# Patient Record
Sex: Male | Born: 1950 | Race: Black or African American | Hispanic: No
Health system: Southern US, Community
[De-identification: ages and names within clinical notes are randomized; demographics above are authoritative.]

---

## 1997-10-30 ENCOUNTER — Emergency Department (HOSPITAL_COMMUNITY): Admission: EM | Admit: 1997-10-30 | Discharge: 1997-10-30 | Payer: Self-pay | Admitting: Emergency Medicine

## 1997-11-02 ENCOUNTER — Ambulatory Visit (HOSPITAL_COMMUNITY): Admission: RE | Admit: 1997-11-02 | Discharge: 1997-11-02 | Payer: Self-pay | Admitting: Pulmonary Disease

## 2000-12-20 ENCOUNTER — Emergency Department (HOSPITAL_COMMUNITY): Admission: EM | Admit: 2000-12-20 | Discharge: 2000-12-20 | Payer: Self-pay | Admitting: Emergency Medicine

## 2000-12-20 ENCOUNTER — Encounter: Payer: Self-pay | Admitting: Emergency Medicine

## 2007-04-26 ENCOUNTER — Ambulatory Visit (HOSPITAL_COMMUNITY): Admission: RE | Admit: 2007-04-26 | Discharge: 2007-04-26 | Payer: Self-pay | Admitting: Pulmonary Disease

## 2009-06-13 ENCOUNTER — Ambulatory Visit (HOSPITAL_COMMUNITY): Admission: RE | Admit: 2009-06-13 | Discharge: 2009-06-13 | Payer: Self-pay | Admitting: Pulmonary Disease

## 2015-10-16 ENCOUNTER — Other Ambulatory Visit: Payer: Self-pay | Admitting: Cardiology

## 2015-10-16 DIAGNOSIS — R079 Chest pain, unspecified: Secondary | ICD-10-CM

## 2015-10-24 ENCOUNTER — Encounter (HOSPITAL_COMMUNITY)
Admission: RE | Admit: 2015-10-24 | Discharge: 2015-10-24 | Disposition: A | Discharge status: Home / Self Care | Payer: Medicare Other | Source: Ambulatory Visit | Attending: Cardiology | Admitting: Cardiology

## 2015-10-24 DIAGNOSIS — R079 Chest pain, unspecified: Secondary | ICD-10-CM | POA: Insufficient documentation

## 2015-10-24 MED ORDER — TECHNETIUM TC 99M TETROFOSMIN IV KIT
10.0000 | PACK | Freq: Once | INTRAVENOUS | Status: AC | PRN
  Administered 2015-10-24: 10 via INTRAVENOUS

## 2015-10-24 MED ORDER — REGADENOSON 0.4 MG/5ML IV SOLN
0.4000 mg | Freq: Once | INTRAVENOUS | Status: AC
  Administered 2015-10-24: 0.4 mg via INTRAVENOUS

## 2015-10-24 MED ORDER — REGADENOSON 0.4 MG/5ML IV SOLN
INTRAVENOUS | Status: AC
  Filled 2015-10-24: qty 5

## 2015-10-24 MED ORDER — TECHNETIUM TC 99M TETROFOSMIN IV KIT
30.0000 | PACK | Freq: Once | INTRAVENOUS | Status: AC | PRN
  Administered 2015-10-24: 30 via INTRAVENOUS

## 2019-05-12 ENCOUNTER — Ambulatory Visit: Payer: Self-pay | Attending: Internal Medicine

## 2019-05-12 DIAGNOSIS — Z23 Encounter for immunization: Secondary | ICD-10-CM | POA: Insufficient documentation

## 2019-05-12 NOTE — Progress Notes (Signed)
   Cov[facility] Vaccination [facility]  Name:  Bradley Woodward    MRN: [id number] DOB: 1951/01/14  05/12/2019  [name] was observed post Covid-19 immunization for 15 minutes without incident. He was provided with Vaccine Information Sheet and instruction to access the V-Safe system.   [name] was instructed to call 911 with any severe reactions post vaccine: Marland Kitchen Difficulty breathing  . Swelling of face and throat  . A fast heartbeat  . A bad rash all over body  . Dizziness and weakness   Immunizations Administered    Name Date Dose VIS Date Route   Pfizer COVID-19 Vaccine 05/12/2019  4:24 PM 0.3 mL 02/18/2019 Intramuscular   Manufacturer: [employer], [employer]   Lot: [id number]   NDC: [address]-8

## 2019-06-08 ENCOUNTER — Ambulatory Visit: Payer: Self-pay | Attending: Internal Medicine

## 2019-06-08 DIAGNOSIS — Z23 Encounter for immunization: Secondary | ICD-10-CM

## 2019-06-08 NOTE — Progress Notes (Signed)
   Covid-19 Vaccination Clinic  Name:  Bradley Woodward    MRN: [id number] DOB: Jun 22, 1950  06/08/2019  [name] was observed post Covid-19 immunization for 15 minutes without incident. He was provided with Vaccine Information Sheet and instruction to access the V-Safe system.   [name] was instructed to call 911 with any severe reactions post vaccine: Marland Kitchen Difficulty breathing  . Swelling of face and throat  . A fast heartbeat  . A bad rash all over body  . Dizziness and weakness   Immunizations Administered    Name Date Dose VIS Date Route   Pfizer COVID-19 Vaccine 06/08/2019  3:21 PM 0.3 mL 02/18/2019 Intramuscular   Manufacturer: [employer], [employer]   Lot: [id number]   NDC: [address]-9

## 2022-01-24 ENCOUNTER — Encounter: Payer: Self-pay | Admitting: Pulmonary Disease

## 2022-01-24 ENCOUNTER — Ambulatory Visit
Admission: RE | Admit: 2022-01-24 | Discharge: 2022-01-24 | Disposition: A | Discharge status: Home / Self Care | Payer: Medicare Other | Source: Ambulatory Visit | Attending: Pulmonary Disease | Admitting: Pulmonary Disease

## 2022-01-24 ENCOUNTER — Other Ambulatory Visit: Payer: Self-pay | Admitting: Pulmonary Disease

## 2022-01-24 DIAGNOSIS — M25512 Pain in left shoulder: Secondary | ICD-10-CM

## 2022-01-28 ENCOUNTER — Other Ambulatory Visit: Payer: Self-pay | Admitting: Pulmonary Disease

## 2022-01-28 ENCOUNTER — Ambulatory Visit
Admission: RE | Admit: 2022-01-28 | Discharge: 2022-01-28 | Disposition: A | Discharge status: Home / Self Care | Payer: Medicare Other | Source: Ambulatory Visit | Attending: Pulmonary Disease | Admitting: Pulmonary Disease

## 2022-01-28 DIAGNOSIS — M5459 Other low back pain: Secondary | ICD-10-CM

## 2022-02-25 ENCOUNTER — Other Ambulatory Visit (HOSPITAL_COMMUNITY): Payer: Self-pay | Admitting: Pulmonary Disease

## 2022-02-25 DIAGNOSIS — R42 Dizziness and giddiness: Secondary | ICD-10-CM

## 2022-03-04 ENCOUNTER — Ambulatory Visit (HOSPITAL_COMMUNITY)
Admission: RE | Admit: 2022-03-04 | Discharge: 2022-03-04 | Disposition: A | Discharge status: Home / Self Care | Payer: Medicare Other | Source: Ambulatory Visit | Attending: Pulmonary Disease | Admitting: Pulmonary Disease

## 2022-03-04 DIAGNOSIS — R42 Dizziness and giddiness: Secondary | ICD-10-CM | POA: Insufficient documentation

## 2022-10-22 ENCOUNTER — Ambulatory Visit (HOSPITAL_COMMUNITY)
Admission: RE | Admit: 2022-10-22 | Discharge: 2022-10-22 | Disposition: A | Discharge status: Home / Self Care | Payer: Medicare Other | Source: Ambulatory Visit | Attending: Vascular Surgery | Admitting: Vascular Surgery

## 2022-10-22 ENCOUNTER — Other Ambulatory Visit (HOSPITAL_COMMUNITY): Payer: Self-pay | Admitting: Pulmonary Disease

## 2022-10-22 DIAGNOSIS — I779 Disorder of arteries and arterioles, unspecified: Secondary | ICD-10-CM

## 2023-03-24 DIAGNOSIS — H9192 Unspecified hearing loss, left ear: Secondary | ICD-10-CM | POA: Diagnosis not present

## 2023-03-24 DIAGNOSIS — I119 Hypertensive heart disease without heart failure: Secondary | ICD-10-CM | POA: Diagnosis not present

## 2023-03-24 DIAGNOSIS — R7302 Impaired glucose tolerance (oral): Secondary | ICD-10-CM | POA: Diagnosis not present

## 2023-03-24 DIAGNOSIS — E559 Vitamin D deficiency, unspecified: Secondary | ICD-10-CM | POA: Diagnosis not present

## 2023-03-24 DIAGNOSIS — M1990 Unspecified osteoarthritis, unspecified site: Secondary | ICD-10-CM | POA: Diagnosis not present

## 2023-03-24 DIAGNOSIS — E7800 Pure hypercholesterolemia, unspecified: Secondary | ICD-10-CM | POA: Diagnosis not present

## 2023-03-24 DIAGNOSIS — R42 Dizziness and giddiness: Secondary | ICD-10-CM | POA: Diagnosis not present

## 2023-03-24 DIAGNOSIS — Z79899 Other long term (current) drug therapy: Secondary | ICD-10-CM | POA: Diagnosis not present

## 2023-03-24 DIAGNOSIS — D638 Anemia in other chronic diseases classified elsewhere: Secondary | ICD-10-CM | POA: Diagnosis not present

## 2023-03-24 DIAGNOSIS — R5383 Other fatigue: Secondary | ICD-10-CM | POA: Diagnosis not present

## 2023-03-24 DIAGNOSIS — I25118 Atherosclerotic heart disease of native coronary artery with other forms of angina pectoris: Secondary | ICD-10-CM | POA: Diagnosis not present

## 2023-03-24 DIAGNOSIS — K5730 Diverticulosis of large intestine without perforation or abscess without bleeding: Secondary | ICD-10-CM | POA: Diagnosis not present

## 2023-08-04 DIAGNOSIS — H9192 Unspecified hearing loss, left ear: Secondary | ICD-10-CM | POA: Diagnosis not present

## 2023-08-04 DIAGNOSIS — I25118 Atherosclerotic heart disease of native coronary artery with other forms of angina pectoris: Secondary | ICD-10-CM | POA: Diagnosis not present

## 2023-08-04 DIAGNOSIS — M1990 Unspecified osteoarthritis, unspecified site: Secondary | ICD-10-CM | POA: Diagnosis not present

## 2023-08-04 DIAGNOSIS — K5730 Diverticulosis of large intestine without perforation or abscess without bleeding: Secondary | ICD-10-CM | POA: Diagnosis not present

## 2023-08-04 DIAGNOSIS — I119 Hypertensive heart disease without heart failure: Secondary | ICD-10-CM | POA: Diagnosis not present

## 2023-08-04 DIAGNOSIS — E7800 Pure hypercholesterolemia, unspecified: Secondary | ICD-10-CM | POA: Diagnosis not present

## 2023-08-04 DIAGNOSIS — D638 Anemia in other chronic diseases classified elsewhere: Secondary | ICD-10-CM | POA: Diagnosis not present

## 2023-08-04 DIAGNOSIS — R5383 Other fatigue: Secondary | ICD-10-CM | POA: Diagnosis not present

## 2023-08-04 DIAGNOSIS — R7302 Impaired glucose tolerance (oral): Secondary | ICD-10-CM | POA: Diagnosis not present

## 2023-08-04 DIAGNOSIS — Z79899 Other long term (current) drug therapy: Secondary | ICD-10-CM | POA: Diagnosis not present

## 2023-12-07 DIAGNOSIS — E559 Vitamin D deficiency, unspecified: Secondary | ICD-10-CM | POA: Diagnosis not present

## 2023-12-07 DIAGNOSIS — R7303 Prediabetes: Secondary | ICD-10-CM | POA: Diagnosis not present

## 2023-12-07 DIAGNOSIS — I10 Essential (primary) hypertension: Secondary | ICD-10-CM | POA: Diagnosis not present

## 2023-12-07 DIAGNOSIS — E785 Hyperlipidemia, unspecified: Secondary | ICD-10-CM | POA: Diagnosis not present
